# Patient Record
Sex: Male | Born: 1975 | Race: Black or African American | Hispanic: No | Marital: Married | Smoking: Never smoker
Health system: Southern US, Community
[De-identification: ages and names within clinical notes are randomized; demographics above are authoritative.]

## PROBLEM LIST (undated history)

## (undated) ENCOUNTER — Ambulatory Visit: Payer: Self-pay | Source: Home / Self Care

## (undated) DIAGNOSIS — Z789 Other specified health status: Secondary | ICD-10-CM

---

## 1999-09-30 ENCOUNTER — Emergency Department (HOSPITAL_COMMUNITY): Admission: EM | Admit: 1999-09-30 | Discharge: 1999-09-30 | Payer: Self-pay

## 2000-08-31 ENCOUNTER — Emergency Department (HOSPITAL_COMMUNITY): Admission: EM | Admit: 2000-08-31 | Discharge: 2000-08-31 | Payer: Self-pay | Admitting: *Deleted

## 2001-04-03 ENCOUNTER — Emergency Department (HOSPITAL_COMMUNITY): Admission: EM | Admit: 2001-04-03 | Discharge: 2001-04-03 | Payer: Self-pay | Admitting: Emergency Medicine

## 2001-04-03 ENCOUNTER — Encounter: Payer: Self-pay | Admitting: Emergency Medicine

## 2001-04-04 ENCOUNTER — Emergency Department (HOSPITAL_COMMUNITY): Admission: EM | Admit: 2001-04-04 | Discharge: 2001-04-04 | Payer: Self-pay | Admitting: Emergency Medicine

## 2003-03-30 ENCOUNTER — Emergency Department (HOSPITAL_COMMUNITY): Admission: AD | Admit: 2003-03-30 | Discharge: 2003-03-30 | Payer: Self-pay | Admitting: Family Medicine

## 2006-09-23 ENCOUNTER — Emergency Department (HOSPITAL_COMMUNITY): Admission: EM | Admit: 2006-09-23 | Discharge: 2006-09-23 | Payer: Self-pay | Admitting: Emergency Medicine

## 2006-10-03 ENCOUNTER — Emergency Department (HOSPITAL_COMMUNITY): Admission: EM | Admit: 2006-10-03 | Discharge: 2006-10-03 | Payer: Self-pay | Admitting: Emergency Medicine

## 2007-02-24 ENCOUNTER — Emergency Department (HOSPITAL_COMMUNITY): Admission: EM | Admit: 2007-02-24 | Discharge: 2007-02-24 | Payer: Self-pay | Admitting: Emergency Medicine

## 2008-10-21 ENCOUNTER — Emergency Department (HOSPITAL_COMMUNITY): Admission: EM | Admit: 2008-10-21 | Discharge: 2008-10-21 | Payer: Self-pay | Admitting: Family Medicine

## 2012-08-14 ENCOUNTER — Emergency Department (INDEPENDENT_AMBULATORY_CARE_PROVIDER_SITE_OTHER)
Admission: EM | Admit: 2012-08-14 | Discharge: 2012-08-14 | Disposition: A | Discharge status: Home / Self Care | Payer: BC Managed Care – PPO | Source: Home / Self Care | Attending: Family Medicine | Admitting: Family Medicine

## 2012-08-14 ENCOUNTER — Encounter (HOSPITAL_COMMUNITY): Payer: Self-pay | Admitting: Emergency Medicine

## 2012-08-14 DIAGNOSIS — T50905A Adverse effect of unspecified drugs, medicaments and biological substances, initial encounter: Secondary | ICD-10-CM

## 2012-08-14 DIAGNOSIS — T887XXA Unspecified adverse effect of drug or medicament, initial encounter: Secondary | ICD-10-CM

## 2012-08-14 MED ORDER — GI COCKTAIL ~~LOC~~
30.0000 mL | Freq: Once | ORAL | Status: AC
  Administered 2012-08-14: 30 mL via ORAL

## 2012-08-14 MED ORDER — GI COCKTAIL ~~LOC~~
ORAL | Status: AC
  Filled 2012-08-14: qty 30

## 2012-08-14 NOTE — ED Notes (Signed)
Pt c/o sharp chest pain with swallowing and eating after taking doxycycl 100mg  that he was prescribed yesterday by the dermatologist.  Pt states that he has not taking any more. Pt has used benadryl and acid reflux med with no relief.

## 2012-08-14 NOTE — ED Provider Notes (Addendum)
History     [name]: [id number]  Arrival date & time 08/14/12  1749   First MD Initiated Contact with Patient 08/14/12 1824      Chief Complaint  Patient presents with  . Chest Pain    pt states that after taking doxycycl 100 mg yesterday. new medication from dermatologist    (Consider location/radiation/quality/duration/timing/severity/associated sxs/prior treatment) Patient is a 37 y.o. male presenting with chest pain. The history is provided by the patient.  Chest Pain Pain location:  Epigastric Pain quality: burning   Pain radiates to:  Does not radiate Pain radiates to the back: no   Pain severity:  Mild Context comment:  Onset yest after taking doxy antibiotic for 6 days. Associated symptoms: abdominal pain, dysphagia and heartburn   Associated symptoms: no cough, no fever, no nausea, no shortness of breath and not vomiting     History reviewed. No pertinent past medical history.  History reviewed. No pertinent past surgical history.  History reviewed. No pertinent family history.  History  Substance Use Topics  . Smoking status: Never Smoker   . Smokeless tobacco: Not on file  . Alcohol Use: No      Review of Systems  Constitutional: Negative.  Negative for fever.  HENT: Positive for trouble swallowing.   Respiratory: Negative for cough and shortness of breath.   Cardiovascular: Positive for chest pain.  Gastrointestinal: Positive for heartburn and abdominal pain. Negative for nausea, vomiting, diarrhea and constipation.  Skin: Negative.     Allergies  Review of patient's allergies indicates no known allergies.  Home Medications  No current outpatient prescriptions on file.  BP 139/80  Pulse 55  Temp(Src) 98.3 F (36.8 C) (Oral)  Resp 16  SpO2 100%  Physical Exam  Nursing note and vitals reviewed. Constitutional: He appears well-developed and well-nourished.  Cardiovascular: Normal rate, regular rhythm, normal heart sounds and intact distal  pulses.   Pulmonary/Chest: Effort normal and breath sounds normal.  Abdominal: Soft. Bowel sounds are normal. There is no hepatosplenomegaly. There is tenderness in the epigastric area. There is no rigidity, no rebound, no guarding and no CVA tenderness.    ED Course  Procedures (including critical care time)  Labs Reviewed - No data to display No results found.   1. Medication adverse effect, initial encounter       MDM          Linna Hoff, MD 08/14/12 1851  Linna Hoff, MD 08/14/12 [phone]

## 2015-03-16 ENCOUNTER — Encounter (HOSPITAL_COMMUNITY): Payer: Self-pay | Admitting: Emergency Medicine

## 2015-03-16 ENCOUNTER — Emergency Department (HOSPITAL_COMMUNITY)
Admission: EM | Admit: 2015-03-16 | Discharge: 2015-03-17 | Disposition: A | Discharge status: Home / Self Care | Payer: BLUE CROSS/BLUE SHIELD | Attending: Emergency Medicine | Admitting: Emergency Medicine

## 2015-03-16 DIAGNOSIS — R197 Diarrhea, unspecified: Secondary | ICD-10-CM | POA: Insufficient documentation

## 2015-03-16 DIAGNOSIS — I10 Essential (primary) hypertension: Secondary | ICD-10-CM | POA: Insufficient documentation

## 2015-03-16 DIAGNOSIS — R140 Abdominal distension (gaseous): Secondary | ICD-10-CM | POA: Diagnosis not present

## 2015-03-16 DIAGNOSIS — R1084 Generalized abdominal pain: Secondary | ICD-10-CM | POA: Diagnosis not present

## 2015-03-16 DIAGNOSIS — R350 Frequency of micturition: Secondary | ICD-10-CM | POA: Insufficient documentation

## 2015-03-16 DIAGNOSIS — E119 Type 2 diabetes mellitus without complications: Secondary | ICD-10-CM | POA: Insufficient documentation

## 2015-03-16 DIAGNOSIS — R1013 Epigastric pain: Secondary | ICD-10-CM | POA: Diagnosis present

## 2015-03-16 NOTE — ED Notes (Signed)
Pt is c/o abd pain that started this morning  Pt states he had 2 loose bowel movements today at work and has been passing gas  Pt states when he got home he took something for gas and upset stomach  Pt states the pain increased after that

## 2015-03-17 LAB — COMPREHENSIVE METABOLIC PANEL
ALBUMIN: 4.5 g/dL (ref 3.5–5.0)
ALT: 42 U/L (ref 17–63)
AST: 58 U/L — AB (ref 15–41)
Alkaline Phosphatase: 62 U/L (ref 38–126)
Anion gap: 7 (ref 5–15)
BUN: 18 mg/dL (ref 6–20)
CHLORIDE: 104 mmol/L (ref 101–111)
CO2: 30 mmol/L (ref 22–32)
Calcium: 9.8 mg/dL (ref 8.9–10.3)
Creatinine, Ser: 1.07 mg/dL (ref 0.61–1.24)
Glucose, Bld: 82 mg/dL (ref 65–99)
POTASSIUM: 3.8 mmol/L (ref 3.5–5.1)
Sodium: 141 mmol/L (ref 135–145)
Total Bilirubin: 2.5 mg/dL — ABNORMAL HIGH (ref 0.3–1.2)
Total Protein: 8.1 g/dL (ref 6.5–8.1)

## 2015-03-17 LAB — LIPASE, BLOOD: Lipase: 21 U/L (ref 11–51)

## 2015-03-17 LAB — CBC
HEMATOCRIT: 43.7 % (ref 39.0–52.0)
Hemoglobin: 14.9 g/dL (ref 13.0–17.0)
MCH: 30.8 pg (ref 26.0–34.0)
MCHC: 34.1 g/dL (ref 30.0–36.0)
MCV: 90.3 fL (ref 78.0–100.0)
Platelets: 230 10*3/uL (ref 150–400)
RBC: 4.84 MIL/uL (ref 4.22–5.81)
RDW: 12 % (ref 11.5–15.5)
WBC: 9.6 10*3/uL (ref 4.0–10.5)

## 2015-03-17 LAB — URINALYSIS, ROUTINE W REFLEX MICROSCOPIC
Bilirubin Urine: NEGATIVE
GLUCOSE, UA: NEGATIVE mg/dL
Hgb urine dipstick: NEGATIVE
Ketones, ur: NEGATIVE mg/dL
LEUKOCYTES UA: NEGATIVE
Nitrite: NEGATIVE
PH: 5.5 (ref 5.0–8.0)
Protein, ur: NEGATIVE mg/dL
SPECIFIC GRAVITY, URINE: 1.026 (ref 1.005–1.030)

## 2015-03-17 MED ORDER — METOCLOPRAMIDE HCL 10 MG PO TABS
10.0000 mg | ORAL_TABLET | Freq: Once | ORAL | Status: AC
  Administered 2015-03-17: 10 mg via ORAL
  Filled 2015-03-17: qty 1

## 2015-03-17 MED ORDER — DICYCLOMINE HCL 10 MG PO CAPS
20.0000 mg | ORAL_CAPSULE | Freq: Once | ORAL | Status: AC
  Administered 2015-03-17: 20 mg via ORAL
  Filled 2015-03-17 (×2): qty 2

## 2015-03-17 NOTE — Discharge Instructions (Signed)
Abdominal Pain, Adult Ms. Tristan Saunders, your blood work today was normal.  See a primary care physician within 3 days for close follow up.  If symptoms worsen, come back to the ED immediately. Thank you. Many things can cause belly (abdominal) pain. Most times, the belly pain is not dangerous. Many cases of belly pain can be watched and treated at home. HOME CARE   Do not take medicines that help you go poop (laxatives) unless told to by your doctor.  Only take medicine as told by your doctor.  Eat or drink as told by your doctor. Your doctor will tell you if you should be on a special diet. GET HELP IF:  You do not know what is causing your belly pain.  You have belly pain while you are sick to your stomach (nauseous) or have runny poop (diarrhea).  You have pain while you pee or poop.  Your belly pain wakes you up at night.  You have belly pain that gets worse or better when you eat.  You have belly pain that gets worse when you eat fatty foods.  You have a fever. GET HELP RIGHT AWAY IF:   The pain does not go away within 2 hours.  You keep throwing up (vomiting).  The pain changes and is only in the right or left part of the belly.  You have bloody or tarry looking poop. MAKE SURE YOU:   Understand these instructions.  Will watch your condition.  Will get help right away if you are not doing well or get worse.   This information is not intended to replace advice given to you by your health care provider. Make sure you discuss any questions you have with your health care provider.   Document Released: 09/12/2007 Document Revised: 04/16/2014 Document Reviewed: 12/03/2012 Elsevier Interactive Patient Education [employer]2016 [employer]

## 2015-03-17 NOTE — ED Provider Notes (Signed)
[name]: [id number]     Arrival date & time 03/16/15  2317 History  By signing my name below, I, Tristan Saunders, attest that this documentation has been prepared under the direction and in the presence of Tomasita CrumbleAdeleke Jaemarie Hochberg, MD. Electronically Signed: Budd PalmerVanessa Saunders, ED Scribe. 03/17/2015. 12:58 AM.    Chief Complaint  Patient presents with  . Abdominal Pain   The history is provided by the patient. No language interpreter was used.   HPI Comments: Tristan StallingSamuel J Dennis Saunders is a 39 y.o. male with a PMHx of HTN and DM who presents to the Emergency Department complaining of sharp, epigastric abdominal pain onset tonight after taking a famotidine pill. He notes his stomach feels "tight." He reports associated frequency, diarrhea, and gassiness all day today before the pain began. He notes he was given a pill famotidine by his father as well as buying and taking some OTC medication for gas and an upset stomach. He denies eating any unusual foods as well as any sick contacts with similar symptoms. Pt denies n/v.   History reviewed. No pertinent past medical history. History reviewed. No pertinent past surgical history. Family History  Problem Relation Age of Onset  . Diabetes Other   . Hypertension Other    Social History  Substance Use Topics  . Smoking status: Never Smoker   . Smokeless tobacco: None  . Alcohol Use: No    Review of Systems A complete 10 system review of systems was obtained and all systems are negative except as noted in the HPI and PMH.   Allergies  Review of patient's allergies indicates no known allergies.  Home Medications   Prior to Admission medications   Medication Sig Start Date End Date Taking? Authorizing Provider  famotidine (PEPCID) 20 MG tablet Take 1 mg by mouth once.   Yes Historical Provider, MD  simethicone (MYLICON) 80 MG chewable tablet Chew 80 mg by mouth every 6 (six) hours as needed for flatulence.   Yes Historical Provider, MD   BP 151/79 mmHg  Pulse 74   Temp(Src) 98.4 F (36.9 C) (Oral)  Resp 16  Ht 5\' 8"  (1.727 m)  Wt 194 lb (87.998 kg)  BMI 29.50 kg/m2  SpO2 100% Physical Exam  Constitutional: He is oriented to person, place, and time. Vital signs are normal. He appears well-developed and well-nourished.  Non-toxic appearance. He does not appear ill. No distress.  HENT:  Head: Normocephalic and atraumatic.  Nose: Nose normal.  Mouth/Throat: Oropharynx is clear and moist. No oropharyngeal exudate.  Eyes: Conjunctivae and EOM are normal. Pupils are equal, round, and reactive to light. No scleral icterus.  Neck: Normal range of motion. Neck supple. No tracheal deviation, no edema, no erythema and normal range of motion present. No thyroid mass and no thyromegaly present.  Cardiovascular: Normal rate, regular rhythm, S1 normal, S2 normal, normal heart sounds, intact distal pulses and normal pulses.  Exam reveals no gallop and no friction rub.   No murmur heard. Pulmonary/Chest: Effort normal and breath sounds normal. No respiratory distress. He has no wheezes. He has no rhonchi. He has no rales.  Abdominal: Soft. Normal appearance and bowel sounds are normal. He exhibits no distension, no ascites and no mass. There is no hepatosplenomegaly. There is no tenderness. There is no rebound, no guarding and no CVA tenderness.  Musculoskeletal: Normal range of motion. He exhibits no edema or tenderness.  Lymphadenopathy:    He has no cervical adenopathy.  Neurological: He is alert and oriented  to person, place, and time. He has normal strength. No cranial nerve deficit or sensory deficit.  Skin: Skin is warm, dry and intact. No petechiae and no rash noted. He is not diaphoretic. No erythema. No pallor.  Psychiatric: He has a normal mood and affect. His behavior is normal. Judgment normal.  Nursing note and vitals reviewed.   ED Course  Procedures  DIAGNOSTIC STUDIES: Oxygen Saturation is 100% on RA, normal by my interpretation.     COORDINATION OF CARE: 12:29 AM - Discussed plans to order Reglan and Bentyl, and wait on diagnostic studies. Pt advised of plan for treatment and pt agrees.  Labs Review Labs Reviewed  COMPREHENSIVE METABOLIC PANEL - Abnormal; Notable for the following:    AST 58 (*)    Total Bilirubin 2.5 (*)    All other components within normal limits  URINALYSIS, ROUTINE W REFLEX MICROSCOPIC (NOT AT [facility]) - Abnormal; Notable for the following:    Color, Urine AMBER (*)    All other components within normal limits  LIPASE, BLOOD  CBC    Imaging Review No results found. I have personally reviewed and evaluated these images and lab results as part of my medical decision-making.   EKG Interpretation None      MDM   Final diagnoses:  None    Patient presents to the emergency department for abdominal pain. He states he initially had diarrhea, after taking famotidine he developed abdominal pain. His abdominal exam here is benign. He was given Bentyl and Reglan for treatment.  Upon repeat evaluation, patient's symptoms have improved. He appears well in no acute distress, vital signs were within his normal limits and he is safe for discharge.  I personally performed the services described in this documentation, which was scribed in my presence. The recorded information has been reviewed and is accurate.     Tomasita Crumble, MD 03/17/15 [phone]

## 2015-11-16 ENCOUNTER — Emergency Department (HOSPITAL_COMMUNITY): Payer: BLUE CROSS/BLUE SHIELD

## 2015-11-16 ENCOUNTER — Encounter (HOSPITAL_COMMUNITY): Payer: Self-pay | Admitting: *Deleted

## 2015-11-16 ENCOUNTER — Emergency Department (HOSPITAL_COMMUNITY)
Admission: EM | Admit: 2015-11-16 | Discharge: 2015-11-17 | Disposition: A | Discharge status: Home / Self Care | Payer: BLUE CROSS/BLUE SHIELD | Attending: Emergency Medicine | Admitting: Emergency Medicine

## 2015-11-16 DIAGNOSIS — Z23 Encounter for immunization: Secondary | ICD-10-CM | POA: Diagnosis not present

## 2015-11-16 DIAGNOSIS — Y92410 Unspecified street and highway as the place of occurrence of the external cause: Secondary | ICD-10-CM | POA: Diagnosis not present

## 2015-11-16 DIAGNOSIS — S060X1A Concussion with loss of consciousness of 30 minutes or less, initial encounter: Secondary | ICD-10-CM | POA: Diagnosis not present

## 2015-11-16 DIAGNOSIS — S60410A Abrasion of right index finger, initial encounter: Secondary | ICD-10-CM | POA: Diagnosis not present

## 2015-11-16 DIAGNOSIS — S70311A Abrasion, right thigh, initial encounter: Secondary | ICD-10-CM | POA: Insufficient documentation

## 2015-11-16 DIAGNOSIS — S43401A Unspecified sprain of right shoulder joint, initial encounter: Secondary | ICD-10-CM | POA: Diagnosis not present

## 2015-11-16 DIAGNOSIS — Y999 Unspecified external cause status: Secondary | ICD-10-CM | POA: Insufficient documentation

## 2015-11-16 DIAGNOSIS — Y939 Activity, unspecified: Secondary | ICD-10-CM | POA: Diagnosis not present

## 2015-11-16 DIAGNOSIS — S0990XA Unspecified injury of head, initial encounter: Secondary | ICD-10-CM | POA: Diagnosis present

## 2015-11-16 DIAGNOSIS — T148XXA Other injury of unspecified body region, initial encounter: Secondary | ICD-10-CM

## 2015-11-16 MED ORDER — ACETAMINOPHEN 500 MG PO TABS
1000.0000 mg | ORAL_TABLET | Freq: Once | ORAL | Status: AC
  Administered 2015-11-16: 1000 mg via ORAL
  Filled 2015-11-16: qty 2

## 2015-11-16 MED ORDER — TETANUS-DIPHTH-ACELL PERTUSSIS 5-2.5-18.5 LF-MCG/0.5 IM SUSP
0.5000 mL | Freq: Once | INTRAMUSCULAR | Status: AC
  Administered 2015-11-16: 0.5 mL via INTRAMUSCULAR
  Filled 2015-11-16: qty 0.5

## 2015-11-16 NOTE — ED Notes (Signed)
Patient transported to CT 
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
## 2015-11-17 NOTE — ED Provider Notes (Signed)
Assumed care at signout CT head results noted I spoke to radiology I spoke to dr Alphonzo Lemmingscabbel with neurosurgery We discussed case He does not feel patient requires admit Pt with GCS 15 He is ambulatory He has mild HA but no vomiting Only request by neurosurgery is to have family monitor him for next day D/w patient He feels comfortable going Told to avoid all NSAIDs/ASA We discussed strict return precautions BP 139/81   Pulse 90   Temp 98.8 F (37.1 C) (Oral)   Resp 18   Ht 5\' 7"  (1.702 m)   Wt 86.6 kg   SpO2 98%   BMI 29.91 kg/m     Zadie Rhineonald Tajh Livsey, MD 11/17/15 0139

## 2015-11-17 NOTE — Discharge Instructions (Signed)
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
## 2017-11-18 ENCOUNTER — Ambulatory Visit (HOSPITAL_COMMUNITY)
Admission: EM | Admit: 2017-11-18 | Discharge: 2017-11-18 | Disposition: A | Discharge status: Home / Self Care | Payer: BLUE CROSS/BLUE SHIELD | Attending: Family Medicine | Admitting: Family Medicine

## 2017-11-18 ENCOUNTER — Encounter (HOSPITAL_COMMUNITY): Payer: Self-pay

## 2017-11-18 DIAGNOSIS — B078 Other viral warts: Secondary | ICD-10-CM

## 2017-11-18 MED ORDER — CIMETIDINE 800 MG PO TABS: 800.0000 mg | ORAL_TABLET | Freq: Every day | ORAL | 2 refills | Status: DC

## 2017-11-18 NOTE — Discharge Instructions (Addendum)
Warts can be treated with liquid nitrogen application at the doctor's office, Podophyllin prescribed by dermatologist, or even wrapping the affected finger with medical adhesive bandage for a month.  I am prescribing Cimetidine for 3 months since it has been shown to work and has the fewest side effects.

## 2017-11-18 NOTE — ED Triage Notes (Signed)
Pt presents with possible wart on right pointer finger

## 2017-11-18 NOTE — ED Provider Notes (Signed)
[facility]   [id number] 11/18/17 Arrival Time: 1854   SUBJECTIVE:  Tristan StallingSamuel J Dennis Saunders is a 42 y.o. male who presents to the urgent care with complaint of skin nodule on right index finger.  This has been present for weeks and is getting painful  Patient delivers patio furniture.  He also lifts weights at the [employer].  He has tried scraping it with a needle and using freezing medicine OTC.  He has a Armed forces operational officerdermatologist.     History reviewed. No pertinent past medical history. Family History  Problem Relation Age of Onset  . Diabetes Other   . Hypertension Other    Social History   Socioeconomic History  . Marital status: Married    Spouse name: Not on file  . Number of children: Not on file  . Years of education: Not on file  . Highest education level: Not on file  Occupational History  . Not on file  Social Needs  . Financial resource strain: Not on file  . Food insecurity:    Worry: Not on file    Inability: Not on file  . Transportation needs:    Medical: Not on file    Non-medical: Not on file  Tobacco Use  . Smoking status: Never Smoker  Substance and Sexual Activity  . Alcohol use: No  . Drug use: No  . Sexual activity: Yes  Lifestyle  . Physical activity:    Days per week: Not on file    Minutes per session: Not on file  . Stress: Not on file  Relationships  . Social connections:    Talks on phone: Not on file    Gets together: Not on file    Attends religious service: Not on file    Active member of club or organization: Not on file    Attends meetings of clubs or organizations: Not on file    Relationship status: Not on file  . Intimate partner violence:    Fear of current or ex partner: Not on file    Emotionally abused: Not on file    Physically abused: Not on file    Forced sexual activity: Not on file  Other Topics Concern  . Not on file  Social History Narrative  . Not on file   No outpatient medications have been marked as taking  for the 11/18/17 encounter [facility](Hospital Encounter).   No Known Allergies    ROS: As per HPI, remainder of ROS negative.   OBJECTIVE:   Vitals:   11/18/17 1945  BP: 130/75  Pulse: 61  Resp: 20  Temp: 98.9 F (37.2 C)  TempSrc: Temporal  SpO2: 100%     General appearance: alert; no distress Eyes: PERRL; EOMI; conjunctiva normal HENT: normocephalic; atraumatic;  oral mucosa normal Neck: supple Back: no CVA tenderness Extremities: no cyanosis or edema; symmetrical with no gross deformities Skin: warm and dry; thickened skin on right volar distal phalanx of index finger c/w wart.  Also has lesion on other hand which is similar. Neurologic: normal gait; grossly normal Psychological: alert and cooperative; normal mood and affect    Labs:  Results for orders placed or performed during the hospital encounter of 03/16/15  Lipase, blood  Result Value Ref Range   Lipase 21 11 - 51 U/L  Comprehensive metabolic panel  Result Value Ref Range   Sodium 141 135 - 145 mmol/L   Potassium 3.8 3.5 - 5.1 mmol/L   Chloride 104 101 - 111 mmol/L  CO2 30 22 - 32 mmol/L   Glucose, Bld 82 65 - 99 mg/dL   BUN 18 6 - 20 mg/dL   Creatinine, Ser 1.611.07 0.61 - 1.24 mg/dL   Calcium 9.8 8.9 - 09.610.3 mg/dL   Total Protein 8.1 6.5 - 8.1 g/dL   Albumin 4.5 3.5 - 5.0 g/dL   AST 58 (H) 15 - 41 U/L   ALT 42 17 - 63 U/L   Alkaline Phosphatase 62 38 - 126 U/L   Total Bilirubin 2.5 (H) 0.3 - 1.2 mg/dL   GFR calc non Af Amer >60 >60 mL/min   GFR calc Af Amer >60 >60 mL/min   Anion gap 7 5 - 15  CBC  Result Value Ref Range   WBC 9.6 4.0 - 10.5 K/uL   RBC 4.84 4.22 - 5.81 MIL/uL   Hemoglobin 14.9 13.0 - 17.0 g/dL   HCT 04.543.7 40.939.0 - 81.152.0 %   MCV 90.3 78.0 - 100.0 fL   MCH 30.8 26.0 - 34.0 pg   MCHC 34.1 30.0 - 36.0 g/dL   RDW 91.412.0 78.211.5 - 95.615.5 %   Platelets 230 150 - 400 K/uL  Urinalysis, Routine w reflex microscopic (not at [facility])  Result Value Ref Range   Color, Urine AMBER (A) YELLOW   APPearance  CLEAR CLEAR   Specific Gravity, Urine 1.026 1.005 - 1.030   pH 5.5 5.0 - 8.0   Glucose, UA NEGATIVE NEGATIVE mg/dL   Hgb urine dipstick NEGATIVE NEGATIVE   Bilirubin Urine NEGATIVE NEGATIVE   Ketones, ur NEGATIVE NEGATIVE mg/dL   Protein, ur NEGATIVE NEGATIVE mg/dL   Nitrite NEGATIVE NEGATIVE   Leukocytes, UA NEGATIVE NEGATIVE    Labs Reviewed - No data to display  No results found.     ASSESSMENT & PLAN:  1. Other viral warts   Warts can be treated with liquid nitrogen application at the doctor's office, Podophyllin prescribed by dermatologist, or even wrapping the affected finger with medical adhesive bandage for a month.  Meds ordered this encounter  Medications  . cimetidine (TAGAMET) 800 MG tablet    Sig: Take 1 tablet (800 mg total) by mouth at bedtime.    Dispense:  30 tablet    Refill:  2    Reviewed expectations re: course of current medical issues. Questions answered. Outlined signs and symptoms indicating need for more acute intervention. Patient verbalized understanding. After Visit Summary given.    Procedures:      Elvina SidleLauenstein, Octavia Mottola, MD 11/18/17 2002

## 2017-11-18 NOTE — ED Notes (Signed)
Pt discharged by provider.

## 2022-07-20 ENCOUNTER — Encounter: Payer: Self-pay | Admitting: Emergency Medicine

## 2022-07-20 ENCOUNTER — Ambulatory Visit
Admission: EM | Admit: 2022-07-20 | Discharge: 2022-07-20 | Disposition: A | Discharge status: Home / Self Care | Payer: BLUE CROSS/BLUE SHIELD | Attending: Emergency Medicine | Admitting: Emergency Medicine

## 2022-07-20 DIAGNOSIS — N3001 Acute cystitis with hematuria: Secondary | ICD-10-CM

## 2022-07-20 LAB — POCT URINALYSIS DIP (MANUAL ENTRY)
Bilirubin, UA: NEGATIVE
Glucose, UA: NEGATIVE mg/dL
Ketones, POC UA: NEGATIVE mg/dL
Nitrite, UA: POSITIVE — AB
Protein Ur, POC: 30 mg/dL — AB
Spec Grav, UA: >=1.03 — AB (ref 1.010–1.025)
Urobilinogen, UA: 1 E.U./dL
pH, UA: 6 (ref 5.0–8.0)

## 2022-07-20 MED ORDER — CIPROFLOXACIN HCL 500 MG PO TABS: 500.0000 mg | ORAL_TABLET | Freq: Two times a day (BID) | ORAL | 0 refills | Status: AC

## 2022-07-20 NOTE — Discharge Instructions (Addendum)
The urinalysis that we performed in the clinic today was abnormal.  Urine culture will be performed per our protocol.  The result of the urine culture will be available in the next 3 to 5 days and will be posted to your MyChart account.  If there is an abnormal finding, you will be contacted by phone and advised of further treatment recommendations, if any.  You were advised to begin antibiotics today because your urinalysis is abnormal and you are having active symptoms of an acute lower urinary tract infection also known as cystitis.  It is very important that you take all doses exactly as prescribed.  Incomplete antibiotic therapy can cause worsening urinary tract infection that can become aggressive, escape from urinary tract into your bloodstream causing sepsis which will require hospitalization.  Please pick up and begin taking your prescription for Ciprofloxacin 500 mg as soon as possible.  Please take all doses exactly as prescribed.  You can take this medication with or without food.  This medication is safe to take with your other medications.  If you have not had complete resolution of your symptoms after completing treatment as prescribed, please return to urgent care for repeat evaluation or follow-up with your primary care provider.   Thank you for visiting urgent care today.  I appreciate the opportunity to participate in your care.

## 2022-07-20 NOTE — ED Provider Notes (Signed)
[name]    [name]: [id number] Arrival date & time: 07/20/22  1902    HISTORY   Chief Complaint  Patient presents with   Dysuria   HPI Tristan Saunders is a pleasant, 47 y.o. male who presents to urgent care today. Pt reports intermittent urinary frequency and intermittent urinary hesitancy. States sometimes it feels like he's straining while urinating.   The history is provided by the patient.   History reviewed. No pertinent past medical history. There are no problems to display for this patient.  History reviewed. No pertinent surgical history.  Home Medications    Prior to Admission medications   Medication Sig Start Date End Date Taking? Authorizing Provider  cimetidine (TAGAMET) 800 MG tablet Take 1 tablet (800 mg total) by mouth at bedtime. 11/18/17   Elvina Sidle, MD    Family History Family History  Problem Relation Age of Onset   Diabetes Other    Hypertension Other    Social History Social History   Tobacco Use   Smoking status: Never  Substance Use Topics   Alcohol use: No   Drug use: No   Allergies   Patient has no known allergies.  Review of Systems Review of Systems Pertinent findings revealed after performing a 14 point review of systems has been noted in the history of present illness.  Physical Exam Vital Signs BP 130/78 (BP Location: Right Arm)   Pulse 71   Temp 98.5 F (36.9 C) (Oral)   Resp 18   SpO2 97%   No data found.  Physical Exam  Visual Acuity Right Eye Distance:   Left Eye Distance:   Bilateral Distance:    Right Eye Near:   Left Eye Near:    Bilateral Near:     UC Couse / Diagnostics / Procedures:     Radiology No results found.  Procedures Procedures (including critical care time) EKG  Pending results:  Labs Reviewed  POCT URINALYSIS DIP (MANUAL ENTRY) - Abnormal; Notable for the following components:      Result Value   Color, UA orange (*)    Spec Grav, UA >=1.030 (*)     Blood, UA trace-intact (*)    Protein Ur, POC =30 (*)    Nitrite, UA Positive (*)    Leukocytes, UA Trace (*)    All other components within normal limits    Medications Ordered in UC: Medications - No data to display  UC Diagnoses / Final Clinical Impressions(s)   I have reviewed the triage vital signs and the nursing notes.  Pertinent labs & imaging results that were available during my care of the patient were reviewed by me and considered in my medical decision making (see chart for details).    Final diagnoses:  Acute cystitis with hematuria   {LMSTDP:27058}  {LMUTIP:27060}  Please see discharge instructions below for details of plan of care as provided to patient. ED Prescriptions     Medication Sig Dispense Auth. Provider   ciprofloxacin (CIPRO) 500 MG tablet Take 1 tablet (500 mg total) by mouth every 12 (twelve) hours for 7 days. 14 tablet Theadora Rama Scales, PA-C      PDMP not reviewed this encounter.  Disposition Upon Discharge:  Condition: stable for discharge home  Patient presented with concern for an acute illness with associated systemic symptoms and significant discomfort requiring urgent management. In my opinion, this is a condition that a prudent lay person (someone who possesses an average knowledge of health  and medicine) may potentially expect to result in complications if not addressed urgently such as respiratory distress, impairment of bodily function or dysfunction of bodily organs.   As such, the patient has been evaluated and assessed, work-up was performed and treatment was provided in alignment with urgent care protocols and evidence based medicine.  Patient/parent/caregiver has been advised that the patient may require follow up for further testing and/or treatment if the symptoms continue in spite of treatment, as clinically indicated and appropriate.  Routine symptom specific, illness specific and/or disease specific instructions were  discussed with the patient and/or caregiver at length.  Prevention strategies for avoiding STD exposure were also discussed.  The patient will follow up with their current PCP if and as advised. If the patient does not currently have a PCP we will assist them in obtaining one.   The patient may need specialty follow up if the symptoms continue, in spite of conservative treatment and management, for further workup, evaluation, consultation and treatment as clinically indicated and appropriate.  Patient/parent/caregiver verbalized understanding and agreement of plan as discussed.  All questions were addressed during visit.  Please see discharge instructions below for further details of plan.  Discharge Instructions:   Discharge Instructions      The urinalysis that we performed in the clinic today was abnormal.  Urine culture will be performed per our protocol.  The result of the urine culture will be available in the next 3 to 5 days and will be posted to your MyChart account.  If there is an abnormal finding, you will be contacted by phone and advised of further treatment recommendations, if any.  You were advised to begin antibiotics today because your urinalysis is abnormal and you are having active symptoms of an acute lower urinary tract infection also known as cystitis.  It is very important that you take all doses exactly as prescribed.  Incomplete antibiotic therapy can cause worsening urinary tract infection that can become aggressive, escape from urinary tract into your bloodstream causing sepsis which will require hospitalization.  Please pick up and begin taking your prescription for Ciprofloxacin 500 mg as soon as possible.  Please take all doses exactly as prescribed.  You can take this medication with or without food.  This medication is safe to take with your other medications.  If you have not had complete resolution of your symptoms after completing treatment as prescribed,  please return to urgent care for repeat evaluation or follow-up with your primary care provider.   Thank you for visiting urgent care today.  I appreciate the opportunity to participate in your care.       This office note has been dictated using Teaching laboratory technician.  Unfortunately, this method of dictation can sometimes lead to typographical or grammatical errors.  I apologize for your inconvenience in advance if this occurs.  Please do not hesitate to reach out to me if clarification is needed.

## 2022-07-20 NOTE — ED Triage Notes (Signed)
Pt reports intermittent urinary frequency and intermittent urinary hesitancy. States sometimes it feels like he's straining while urinating.

## 2023-07-11 ENCOUNTER — Ambulatory Visit
Admission: EM | Admit: 2023-07-11 | Discharge: 2023-07-11 | Disposition: A | Discharge status: Home / Self Care | Payer: Self-pay | Attending: Physician Assistant | Admitting: Physician Assistant

## 2023-07-11 VITALS — BP 134/77 | HR 69 | Temp 98.0°F | Resp 16

## 2023-07-11 DIAGNOSIS — M545 Low back pain, unspecified: Secondary | ICD-10-CM

## 2023-07-11 DIAGNOSIS — M542 Cervicalgia: Secondary | ICD-10-CM

## 2023-07-11 MED ORDER — PREDNISONE 20 MG PO TABS: 40.0000 mg | ORAL_TABLET | Freq: Every day | ORAL | 0 refills | Status: AC

## 2023-07-11 MED ORDER — CYCLOBENZAPRINE HCL 10 MG PO TABS: 10.0000 mg | ORAL_TABLET | Freq: Two times a day (BID) | ORAL | 0 refills | Status: DC | PRN
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
## 2024-01-24 ENCOUNTER — Ambulatory Visit
Admission: EM | Admit: 2024-01-24 | Discharge: 2024-01-24 | Disposition: A | Discharge status: Home / Self Care | Payer: Self-pay | Attending: Nurse Practitioner | Admitting: Nurse Practitioner

## 2024-01-24 ENCOUNTER — Encounter: Payer: Self-pay | Admitting: Emergency Medicine

## 2024-01-24 DIAGNOSIS — S39012A Strain of muscle, fascia and tendon of lower back, initial encounter: Secondary | ICD-10-CM

## 2024-01-24 HISTORY — DX: Other specified health status: Z78.9

## 2024-01-24 MED ORDER — CYCLOBENZAPRINE HCL 10 MG PO TABS: 10.0000 mg | ORAL_TABLET | Freq: Every day | ORAL | 0 refills | Status: AC

## 2024-01-24 MED ORDER — PREDNISONE 10 MG PO TABS: ORAL_TABLET | ORAL | 0 refills | Status: AC

## 2024-01-24 MED ORDER — ACETAMINOPHEN 325 MG PO TABS
650.0000 mg | ORAL_TABLET | Freq: Once | ORAL | Status: AC
  Administered 2024-01-24: 650 mg via ORAL

## 2024-01-24 MED ORDER — NAPROXEN 500 MG PO TABS: 500.0000 mg | ORAL_TABLET | Freq: Two times a day (BID) | ORAL | 0 refills | Status: AC
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
